# Patient Record
Sex: Female | Born: 1935 | Race: White | Hispanic: No
Health system: Southern US, Community
[De-identification: ages and names within clinical notes are randomized; demographics above are authoritative.]

## PROBLEM LIST (undated history)

## (undated) DIAGNOSIS — I4891 Unspecified atrial fibrillation: Secondary | ICD-10-CM

## (undated) DIAGNOSIS — F0390 Unspecified dementia without behavioral disturbance: Secondary | ICD-10-CM

---

## 2010-04-20 ENCOUNTER — Ambulatory Visit: Payer: Self-pay | Admitting: Emergency Medicine

## 2010-04-20 DIAGNOSIS — N390 Urinary tract infection, site not specified: Secondary | ICD-10-CM

## 2010-04-20 DIAGNOSIS — I10 Essential (primary) hypertension: Secondary | ICD-10-CM | POA: Insufficient documentation

## 2010-04-20 LAB — CONVERTED CEMR LAB
Bilirubin Urine: NEGATIVE
Ketones, urine, test strip: NEGATIVE
Protein, U semiquant: NEGATIVE
pH: 6.5

## 2010-04-21 ENCOUNTER — Encounter: Payer: Self-pay | Admitting: Emergency Medicine

## 2010-11-02 NOTE — Assessment & Plan Note (Signed)
Summary: POSS UTI/TJ   Vital Signs:  Patient Profile:   77 Years Old Female CC:      Dysuria x 4 days Height:     62 inches Weight:      145 pounds O2 Sat:      96 % O2 treatment:    Room Air Temp:     97.3 degrees F oral Pulse rate:   78 / minute Pulse rhythm:   regular Resp:     14 per minute BP sitting:   133 / 82  (right arm) Cuff size:   regular  Vitals Entered By: Emilio Math (April 20, 2010 1:48 PM)                  Current Allergies: ! Joyce Copa ! PREDNISONEHistory of Present Illness Chief Complaint: Dysuria x 4 days History of Present Illness: Patient with multiple medical complaints.  77yo WF with sinus drainage 1 week ago, went to [facility] and given a Zpak.  She then developed upset stomach, diarrhea that has mostly gone away.  Now with dysuria for the last few days and a subjective mild fever and mild HA.  Hasn't taken any other medicines.  She also c/o chronic knee and hand pain and back pain from OA, eye twitching.  She has a f/u appt with her PCP schedule this week.  Current Meds ATENOLOL 25 MG TABS (ATENOLOL)  CLARITIN 10 MG TABS (LORATADINE)  CELEBREX 50 MG CAPS (CELECOXIB)  LORTAB 5-500 MG TABS (HYDROCODONE-ACETAMINOPHEN)  FISH OIL 1000 MG CAPS (OMEGA-3 FATTY ACIDS)  VITAMIN D 400 UNIT TABS (CHOLECALCIFEROL)  GLUCOSAMINE 500 MG CAPS (GLUCOSAMINE SULFATE)  CIPROFLOXACIN HCL 250 MG TABS (CIPROFLOXACIN HCL) 1 tab by mouth two times a day for 7 days  REVIEW OF SYSTEMS Constitutional Symptoms      Denies fever, chills, night sweats, weight loss, weight gain, and fatigue.  Eyes       Denies change in vision, eye pain, eye discharge, glasses, contact lenses, and eye surgery. Ear/Nose/Throat/Mouth       Denies hearing loss/aids, change in hearing, ear pain, ear discharge, dizziness, frequent runny nose, frequent nose bleeds, sinus problems, sore throat, hoarseness, and tooth pain or bleeding.  Respiratory       Denies dry cough, productive cough, wheezing,  shortness of breath, asthma, bronchitis, and emphysema/COPD.  Cardiovascular       Denies murmurs, chest pain, and tires easily with exhertion.    Gastrointestinal       Denies stomach pain, nausea/vomiting, diarrhea, constipation, blood in bowel movements, and indigestion. Genitourniary       Complains of painful urination.      Denies kidney stones and loss of urinary control. Neurological       Denies paralysis, seizures, and fainting/blackouts. Musculoskeletal       Denies muscle pain, joint pain, joint stiffness, decreased range of motion, redness, swelling, muscle weakness, and gout.  Skin       Denies bruising, unusual mles/lumps or sores, and hair/skin or nail changes.  Psych       Denies mood changes, temper/anger issues, anxiety/stress, speech problems, depression, and sleep problems.  Past History:  Past Medical History: Polio Artritis Hypertension  Past Surgical History: Denies surgical history  Family History: Mother, D, Stroke Father, D, Stroke  Social History: Non smoker ETOH-no No DRugs Retired,  Physical Exam General appearance: well developed, well nourished, no acute distress Ears: normal, no lesions or deformities Nasal: mucosa pink, nonedematous, no septal deviation, turbinates  normal Oral/Pharynx: tongue normal, posterior pharynx without erythema or exudate Thyroid: no nodules, masses, tenderness, or enlargement Chest/Lungs: no rales, wheezes, or rhonchi bilateral, breath sounds equal without effort Abdomen: soft, non-tender without obvious organomegaly Back: no CVAT Skin: no obvious rashes or lesions MSE: oriented to time, place, and person Assessment New Problems: URINARY TRACT INFECTION, ACUTE (ICD-599.0) HYPERTENSION (ICD-401.9)   Plan New Medications/Changes: CIPROFLOXACIN HCL 250 MG TABS (CIPROFLOXACIN HCL) 1 tab by mouth two times a day for 7 days  #14 x 0, 04/20/2010, Hoyt Koch MD  New Orders: New Patient Level III  [phone] UA Dipstick w/o Micro (automated)  [81003] T-Culture, Urine [[id number]]  The patient and/or caregiver has been counseled thoroughly with regard to medications prescribed including dosage, schedule, interactions, rationale for use, and possible side effects and they verbalize understanding.  Diagnoses and expected course of recovery discussed and will return if not improved as expected or if the condition worsens. Patient and/or caregiver verbalized understanding.  Prescriptions: CIPROFLOXACIN HCL 250 MG TABS (CIPROFLOXACIN HCL) 1 tab by mouth two times a day for 7 days  #14 x 0   Entered and Authorized by:   Hoyt Koch MD   Signed by:   Hoyt Koch MD on 04/20/2010   Method used:   Printed then faxed to ...       [facility] (retail)       [address]       [place], [place]  [address]       Ph: [phone]       Fax: [phone]   RxID:   [id number]   Patient Instructions: 1)  Increase hydration 2)  Wipe front to back 3)  Rest 4)  Tylenol for headache/fever 5)  Follow-up with your primary care physician to discuss your other medical problems 6)  If symptoms gets more severe or more acute issues, go to the ER  Orders Added: 1)  New Patient Level III [99203] 2)  UA Dipstick w/o Micro (automated)  [81003] 3)  T-Culture, Urine [[id number]]  Laboratory Results   Urine Tests  Date/Time Received: April 20, 2010 2:06 PM  Date/Time Reported: April 20, 2010 2:06 PM   Routine Urinalysis   Color: yellow Appearance: Clear Glucose: negative   (Normal Range: Negative) Bilirubin: negative   (Normal Range: Negative) Ketone: negative   (Normal Range: Negative) Spec. Gravity: 1.015   (Normal Range: 1.003-1.035) Blood: small   (Normal Range: Negative) pH: 6.5   (Normal Range: 5.0-8.0) Protein: negative   (Normal Range: Negative) Urobilinogen: 0.2   (Normal Range: 0-1) Nitrite: negative   (Normal Range: Negative) Leukocyte Esterace: small   (Normal Range:  Negative)

## 2021-01-22 ENCOUNTER — Encounter (HOSPITAL_COMMUNITY): Payer: Self-pay | Admitting: *Deleted

## 2021-01-22 ENCOUNTER — Other Ambulatory Visit: Payer: Self-pay

## 2021-01-22 ENCOUNTER — Emergency Department (HOSPITAL_COMMUNITY): Payer: Medicare Other

## 2021-01-22 ENCOUNTER — Emergency Department (HOSPITAL_COMMUNITY)
Admission: EM | Admit: 2021-01-22 | Discharge: 2021-01-22 | Disposition: A | Discharge status: Home / Self Care | Payer: Medicare Other | Attending: Emergency Medicine | Admitting: Emergency Medicine

## 2021-01-22 DIAGNOSIS — W19XXXA Unspecified fall, initial encounter: Secondary | ICD-10-CM

## 2021-01-22 DIAGNOSIS — F0390 Unspecified dementia without behavioral disturbance: Secondary | ICD-10-CM | POA: Insufficient documentation

## 2021-01-22 DIAGNOSIS — I4891 Unspecified atrial fibrillation: Secondary | ICD-10-CM | POA: Diagnosis not present

## 2021-01-22 DIAGNOSIS — Y92121 Bathroom in nursing home as the place of occurrence of the external cause: Secondary | ICD-10-CM | POA: Insufficient documentation

## 2021-01-22 DIAGNOSIS — K029 Dental caries, unspecified: Secondary | ICD-10-CM | POA: Insufficient documentation

## 2021-01-22 DIAGNOSIS — S01111A Laceration without foreign body of right eyelid and periocular area, initial encounter: Secondary | ICD-10-CM | POA: Diagnosis not present

## 2021-01-22 DIAGNOSIS — W1812XA Fall from or off toilet with subsequent striking against object, initial encounter: Secondary | ICD-10-CM | POA: Diagnosis not present

## 2021-01-22 DIAGNOSIS — Z23 Encounter for immunization: Secondary | ICD-10-CM | POA: Diagnosis not present

## 2021-01-22 DIAGNOSIS — S025XXA Fracture of tooth (traumatic), initial encounter for closed fracture: Secondary | ICD-10-CM | POA: Insufficient documentation

## 2021-01-22 DIAGNOSIS — S5001XA Contusion of right elbow, initial encounter: Secondary | ICD-10-CM | POA: Diagnosis not present

## 2021-01-22 DIAGNOSIS — R52 Pain, unspecified: Secondary | ICD-10-CM

## 2021-01-22 DIAGNOSIS — S0993XA Unspecified injury of face, initial encounter: Secondary | ICD-10-CM | POA: Diagnosis present

## 2021-01-22 DIAGNOSIS — S0083XA Contusion of other part of head, initial encounter: Secondary | ICD-10-CM

## 2021-01-22 HISTORY — DX: Unspecified atrial fibrillation: I48.91

## 2021-01-22 HISTORY — DX: Unspecified dementia, unspecified severity, without behavioral disturbance, psychotic disturbance, mood disturbance, and anxiety: F03.90

## 2021-01-22 MED ORDER — LIDOCAINE-EPINEPHRINE-TETRACAINE (LET) TOPICAL GEL
3.0000 mL | Freq: Once | TOPICAL | Status: AC
  Administered 2021-01-22: 3 mL via TOPICAL
  Filled 2021-01-22: qty 3

## 2021-01-22 MED ORDER — ACETAMINOPHEN 500 MG PO TABS
1000.0000 mg | ORAL_TABLET | Freq: Once | ORAL | Status: AC
  Administered 2021-01-22: 1000 mg via ORAL
  Filled 2021-01-22: qty 2

## 2021-01-22 MED ORDER — TETANUS-DIPHTH-ACELL PERTUSSIS 5-2.5-18.5 LF-MCG/0.5 IM SUSY
0.5000 mL | PREFILLED_SYRINGE | Freq: Once | INTRAMUSCULAR | Status: AC
  Administered 2021-01-22: 0.5 mL via INTRAMUSCULAR
  Filled 2021-01-22: qty 0.5

## 2021-01-22 MED ORDER — LIDOCAINE-EPINEPHRINE (PF) 2 %-1:200000 IJ SOLN
10.0000 mL | Freq: Once | INTRAMUSCULAR | Status: AC
  Administered 2021-01-22: 10 mL
  Filled 2021-01-22: qty 20

## 2021-01-22 NOTE — ED Notes (Signed)
Called and spoke with Stephanie @[facility].

## 2021-01-22 NOTE — ED Notes (Signed)
Attempted x 2 to call [employer] [phone] no answer

## 2021-01-22 NOTE — Discharge Instructions (Addendum)
The CAT scan of the face and head are negative and there is no underlying injury.  The elbow x-ray is also normal.  The stitches need to be removed in 5 to 7 days.  Ice to the right eye to help with swelling.  Tylenol 500 mg every 6 hours as needed for pain.  Tetanus shot was updated today.

## 2021-01-22 NOTE — ED Notes (Signed)
Waiting PTAR 

## 2021-01-22 NOTE — ED Triage Notes (Signed)
Patient arrives to ED via [facility] states she was sitting on the commode and fell off, patient is in the independent/dememtia  Patient has a laceration over her right eye and beside her right eye, oozing blood , patient is on thinners. C/o pain right elbow with abrasion  And pain in her right knee. Patient vomited enroute x 1.

## 2021-01-22 NOTE — ED Provider Notes (Signed)
[facility] EMERGENCY DEPARTMENT Provider Note   CSN: [id number] Arrival date & time: 01/22/21  1022     History Chief Complaint  Patient presents with  . Fall    Sandra Ramirez is a 87 y.o. female.  Patient is a 87 year old female with a history of atrial fibrillation, dementia, recurrent falls, unsteady gait who is presenting today with paramedics after a fall at her assisted living.  Patient lives in a dementia assisted living unit.  She had fallen off the toilet and hit the right side of her face.  It is unclear if anybody witnessed this fall.  Patient reports that she cannot remember.  She does not remember if she was feeling well when she woke up this morning.  It was not reported that she had any nausea vomiting or change in behavior recently.  Patient does take Pradaxa for her atrial fibrillation.  She did hit the right side of her face and is also complaining of pain in her right elbow.  She had 1 episode of emesis while being transported to the hospital but denies any nausea now.  She did not receive any medications prior to arrival.  Unknown known last tetanus shot.  Patient's daughter is supposed to be coming who may be able to put supply more information.  The history is provided by the patient, the EMS personnel and the nursing home.  Fall This is a recurrent problem.       Past Medical History:  Diagnosis Date  . A-fib (HCC)   . Dementia (HCC)     There are no problems to display for this patient.      OB History   No obstetric history on file.     No family history on file.  Social History   Tobacco Use  . Smoking status: Unknown If Ever Smoked  Substance Use Topics  . Alcohol use: Not Currently  . Drug use: Never    Home Medications Prior to Admission medications   Not on File    Allergies    Patient has no allergy information on record.  Review of Systems   Review of Systems  Unable to perform ROS: Dementia     Physical Exam Updated Vital Signs BP 134/78 (BP Location: Left Arm)   Pulse 64   Temp (!) 97.4 F (36.3 C) (Oral)   Resp 20   Ht 5\' 5"  (1.651 m)   Wt 72.6 kg   SpO2 94%   BMI 26.63 kg/m   Physical Exam Vitals and nursing note reviewed.  Constitutional:      General: She is not in acute distress.    Appearance: She is well-developed.  HENT:     Head: Normocephalic and atraumatic.      Mouth/Throat:     Comments: Multiple dental caries and broken teeth noted Eyes:     Pupils: Pupils are equal, round, and reactive to light.  Cardiovascular:     Rate and Rhythm: Normal rate and regular rhythm.     Pulses: Normal pulses.     Heart sounds: Normal heart sounds. No murmur heard. No friction rub.  Pulmonary:     Effort: Pulmonary effort is normal.     Breath sounds: Normal breath sounds. No wheezing or rales.  Abdominal:     General: Bowel sounds are normal. There is no distension.     Palpations: Abdomen is soft.     Tenderness: There is no abdominal tenderness. There is no guarding  or rebound.  Musculoskeletal:        General: Tenderness present. Normal range of motion.     Right shoulder: Normal.     Left shoulder: Normal.     Left elbow: Normal.       Arms:     Cervical back: Normal range of motion and neck supple. No tenderness. No spinous process tenderness or muscular tenderness.     Right hip: Normal.     Left hip: Normal.     Right knee: Normal.     Left knee: Normal.       Legs:     Comments: No edema  Skin:    General: Skin is warm and dry.     Findings: No rash.  Neurological:     Mental Status: She is alert.     Cranial Nerves: No cranial nerve deficit.     Comments: Oriented to self.  Able to move all extremities no numbness or weakness noted.  Psychiatric:     Comments: Calm and cooperative      ED Results / Procedures / Treatments   Labs (all labs ordered are listed, but only abnormal results are displayed) Labs Reviewed - No data to  display  EKG EKG Interpretation  Date/Time:  Friday January 22 2021 10:53:23 EDT Ventricular Rate:  62 PR Interval:  175 QRS Duration: 106 QT Interval:  416 QTC Calculation: 423 R Axis:   57 Text Interpretation: Sinus rhythm Artifact Confirmed by Gwyneth SproutPlunkett, Cannon Arreola ([id number]) on 01/22/2021 10:54:58 AM   Radiology DG Elbow 2 Views Right  Result Date: 01/22/2021 CLINICAL DATA:  Pain post fall EXAM: RIGHT ELBOW - 2 VIEW COMPARISON:  None FINDINGS: Patient refused four view series, so two views were performed. Osseous mineralization normal. Joint spaces preserved. No acute fracture, dislocation, or bone destruction. No joint effusion. IMPRESSION: No acute osseous abnormalities. Electronically Signed   By: Ulyses SouthwardMark  Boles M.D.   On: 01/22/2021 12:06   CT Head Wo Contrast  Result Date: 01/22/2021 CLINICAL DATA:  Head trauma EXAM: CT HEAD WITHOUT CONTRAST TECHNIQUE: Contiguous axial images were obtained from the base of the skull through the vertex without intravenous contrast. COMPARISON:  August 29, 2015. FINDINGS: Brain: No evidence of acute infarction, hemorrhage, hydrocephalus, extra-axial collection or mass lesion/mass effect. Similar patchy white matter hypoattenuation, likely related to moderate to advanced chronic microvascular ischemic disease. Similar generalized cerebral atrophy and ventriculomegaly. Vascular: Calcific atherosclerosis. No hyperdense vessel identified. Skull: Right frontal scalp contusion without acute fracture. Sinuses/Orbits: Evaluated on concurrent maxillofacial CT. Other: No mastoid effusions. IMPRESSION: 1. No evidence of acute intracranial abnormality. 2. Right frontal scalp contusion without acute calvarial fracture. Please see concurrent maxillofacial CT for evaluation of the face. 3. Similar chronic microvascular ischemic disease, atrophy, and chronic ventriculomegaly. Electronically Signed   By: Feliberto HartsFrederick S Jones MD   On: 01/22/2021 11:48   DG Chest Port 1 View  Result  Date: 01/22/2021 CLINICAL DATA:  Fall.  On blood thinner for atrial fibrillation EXAM: PORTABLE CHEST 1 VIEW COMPARISON:  None. FINDINGS: Heart size and vascularity normal. Negative for heart failure. Atherosclerotic calcification thoracic aorta. Lungs clear without infiltrate effusion Advanced degenerative change in the shoulder bilaterally. IMPRESSION: No active disease. Electronically Signed   By: Marlan Palauharles  Clark M.D.   On: 01/22/2021 11:00   CT Maxillofacial Wo Contrast  Result Date: 01/22/2021 CLINICAL DATA:  Fall, facial trauma on the right. EXAM: CT MAXILLOFACIAL WITHOUT CONTRAST TECHNIQUE: Multidetector CT imaging of the maxillofacial structures was performed. Multiplanar CT  image reconstructions were also generated. COMPARISON:  CT head 01/22/2021 FINDINGS: Osseous: Negative for facial fracture Cervical spondylosis and spinal stenosis C2-3 and C3-4. Spinal stenosis at C3-4 appears moderate to severe. Orbits: Negative for fracture of the orbit. No orbital mass or edema. Bilateral cataract extraction. Sinuses: Mild mucosal edema paranasal sinuses.  No air-fluid level Soft tissues: Right supraorbital scalp contusion and hematoma Limited intracranial: CT head reported separately. IMPRESSION: Right supraorbital scalp hematoma.  Negative for facial fracture Cervical spondylosis with moderate to severe spinal stenosis at C3-4 and moderate spinal stenosis at C2-3. Electronically Signed   By: Marlan Palau M.D.   On: 01/22/2021 11:37    Procedures Procedures   LACERATION REPAIR Performed by: [facility] Authorized by: Gwyneth Sprout Consent: Verbal consent obtained. Risks and benefits: risks, benefits and alternatives were discussed Consent given by: patient Patient identity confirmed: provided demographic data Prepped and Draped in normal sterile fashion Wound explored  Laceration Location: right eyebrow  Laceration Length: 3cm  No Foreign Bodies seen or palpated  Anesthesia: local  infiltration  Local anesthetic: lidocaine 2% with epinephrine  Anesthetic total: 4 ml  Irrigation method: syringe Amount of cleaning: standard  Skin closure: 6.0 prolene  Number of sutures: 5  Technique: simple interrupted  Patient tolerance: Patient tolerated the procedure well with no immediate complications.   Medications Ordered in ED Medications  lidocaine-EPINEPHrine-tetracaine (LET) topical gel (has no administration in time range)    ED Course  I have reviewed the triage vital signs and the nursing notes.  Pertinent labs & imaging results that were available during my care of the patient were reviewed by me and considered in my medical decision making (see chart for details).    MDM Rules/Calculators/A&P                          Elderly female presenting today after a fall at home.  Assume this fall was unwitnessed but patient does have a history of unsteady gait and recurrent falls.  She does take Pradaxa for atrial fibrillation.  Patient has notable injury to the right side of the face with minor lacerations.  Patient was a level 2 given fall with head trauma and anticoagulation.  Head CT, facial CT are pending.  Unsure of the circumstance of the fall will wait for daughter to arrive to provide more history.  She also has some pain over the right elbow which will be x-rayed.  She otherwise has no other signs of trauma.  She is well-appearing awake and alert.  12:23 PM Patient's son has now arrived and reports that she was in the bathroom today with nursing staff and she was not to get up but she attempted to get up and fell forward hitting her face.  There was no syncope and patient has otherwise been acting her normal self.  She is in a wheelchair all the time due to severe weakness in her legs from polio and recurrent falls.  Patient's head CT, facial CT and elbow films are without acute findings.  Facial laceration was repaired as above.  Son did not know when her last  tetanus shot was and it is overdue in our system.  Updated today.  Patient is stable for discharge.  MDM Number of Diagnoses or Management Options   Amount and/or Complexity of Data Reviewed Tests in the radiology section of CPT: ordered and reviewed Independent visualization of images, tracings, or specimens: yes    Final  Clinical Impression(s) / ED Diagnoses Final diagnoses:  Fall, initial encounter  Contusion of face, initial encounter  Laceration of right eyebrow, initial encounter    Rx / DC Orders ED Discharge Orders    None       Gwyneth Sprout, MD 01/22/21 1256

## 2021-01-22 NOTE — ED Notes (Signed)
Patient transported to CT 

## 2021-01-22 NOTE — ED Notes (Signed)
[address] [place] and spoke with Phyllis Ginger. He is the pt's nurse. He stated there is no problem with the pot coming back with family member

## 2021-01-22 NOTE — ED Notes (Signed)
Called for PTAR added to list at [phone]

## 2021-01-22 NOTE — ED Notes (Signed)
Son at bedside patient remains in xray

## 2021-07-03 DEATH — deceased

## 2021-09-19 IMAGING — CT CT MAXILLOFACIAL W/O CM
3 series · 14 of 47 positions shown, 16 images · non-contrast
Comparison: CT head 01/22/2021

CLINICAL DATA: Fall, facial trauma on the right.

EXAM:
CT MAXILLOFACIAL WITHOUT CONTRAST
TECHNIQUE: Multidetector CT imaging of the maxillofacial structures was
performed. Multiplanar CT image reconstructions were also generated.

[Series 3: facial/ orbits 2.0 h30s · axial · 0.36mm/px · z∈[-165,-33]mm · 8 of 78 slices shown, 10 images]
[im 6/78  brain]
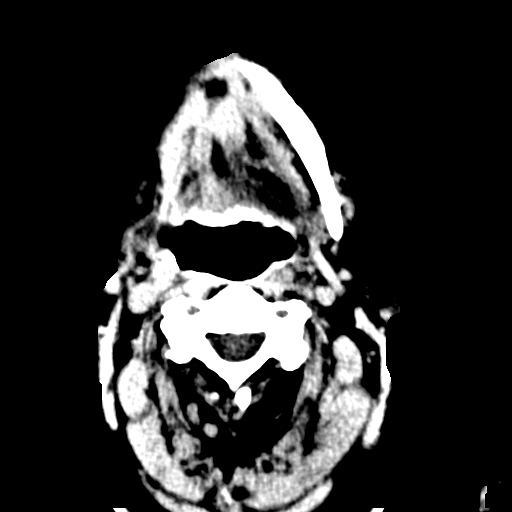
[im 6/78  bone]
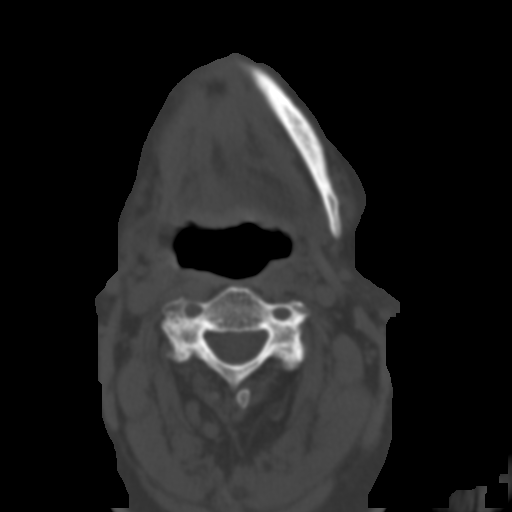
[im 16/78  bone]
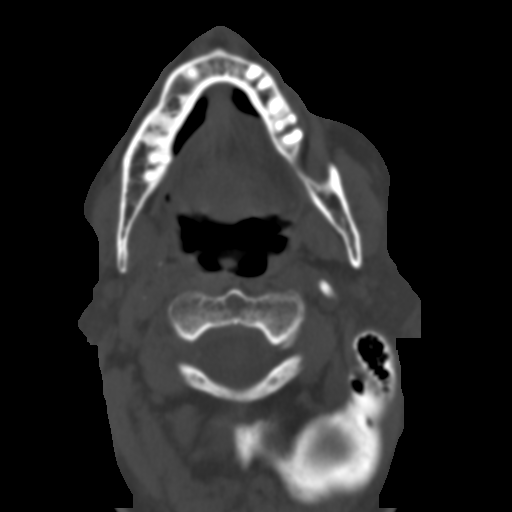
[im 24/78  bone]
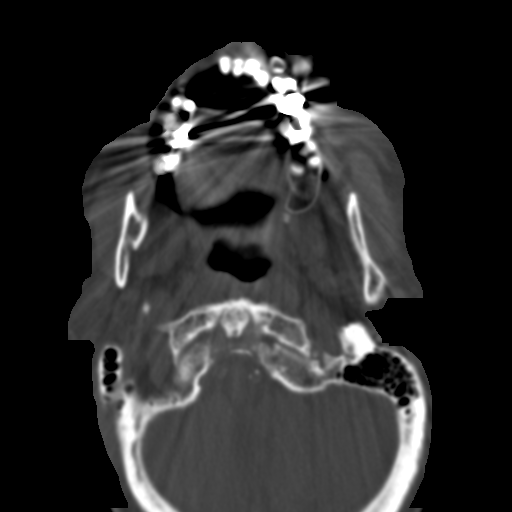
[im 35/78  bone]
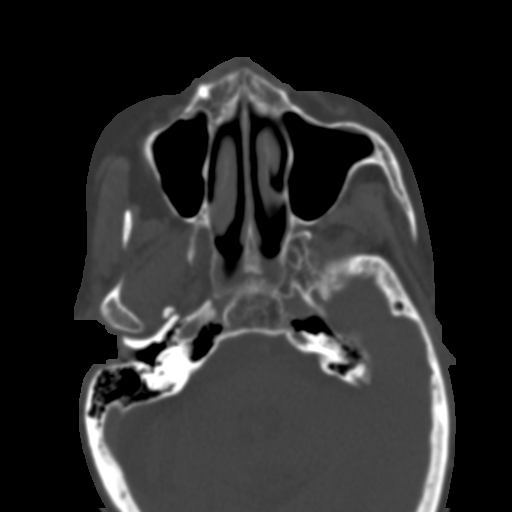
[im 43/78  brain]
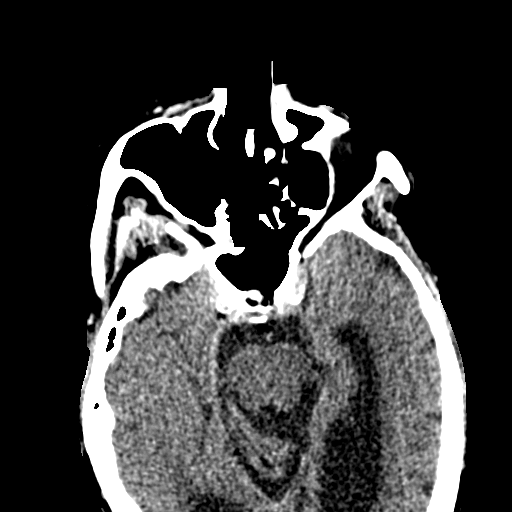
[im 43/78  bone]
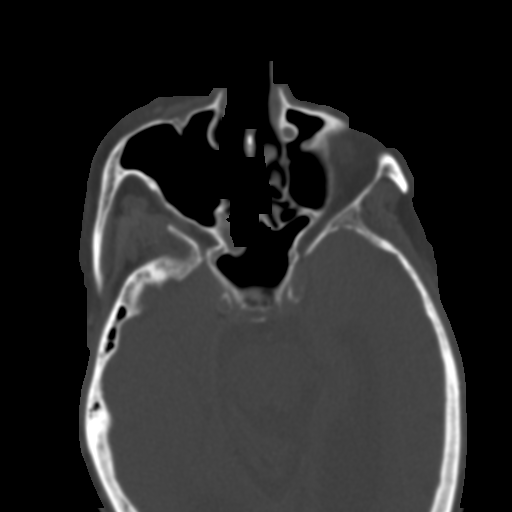
[im 54/78  bone]
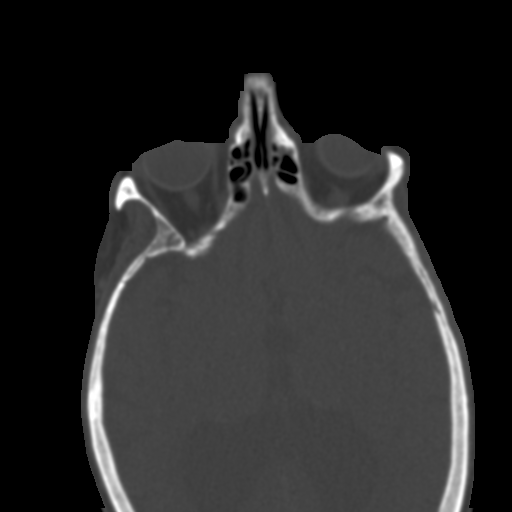
[im 62/78  bone]
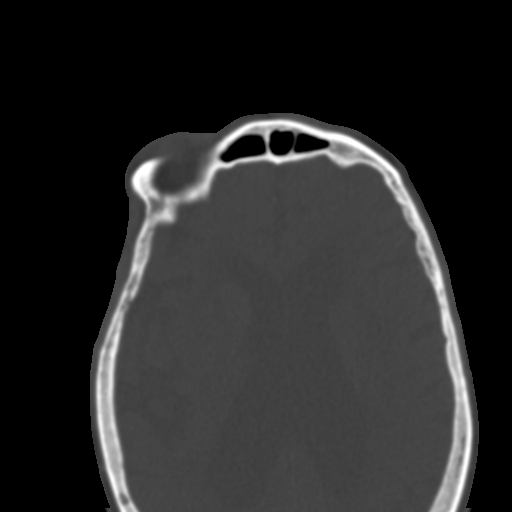
[im 72/78  bone]
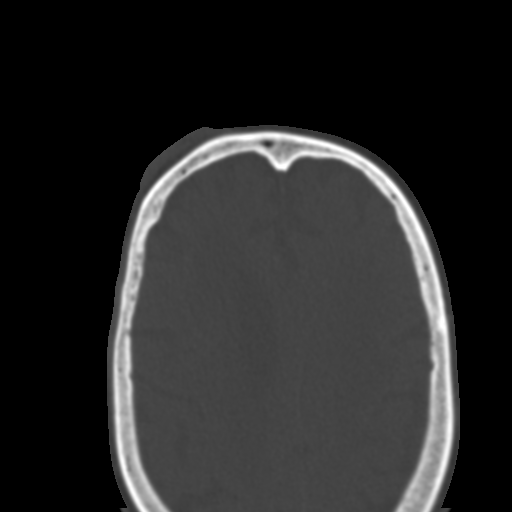

[Series 7: coronal soft tissue · coronal · 0.38mm/px · 3 of 107 slices shown]
[im 36/107  bone]
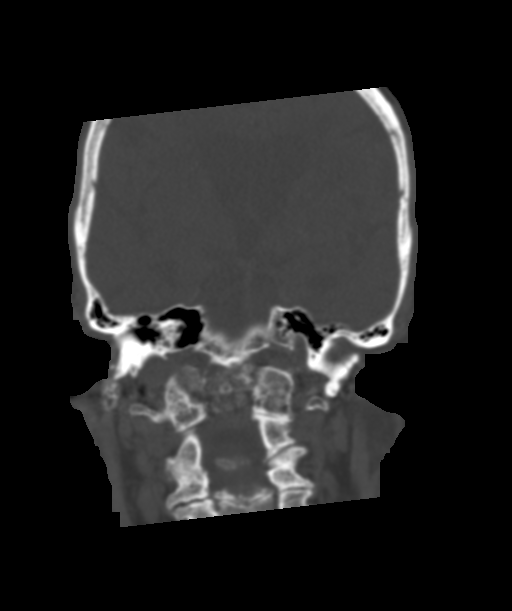
[im 48/107  bone]
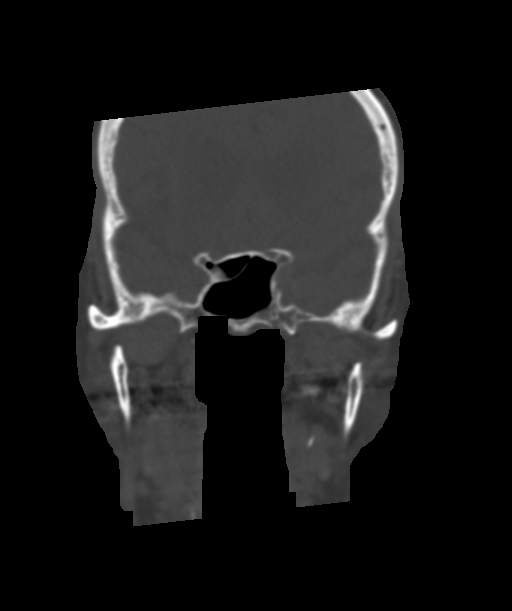
[im 59/107  bone]
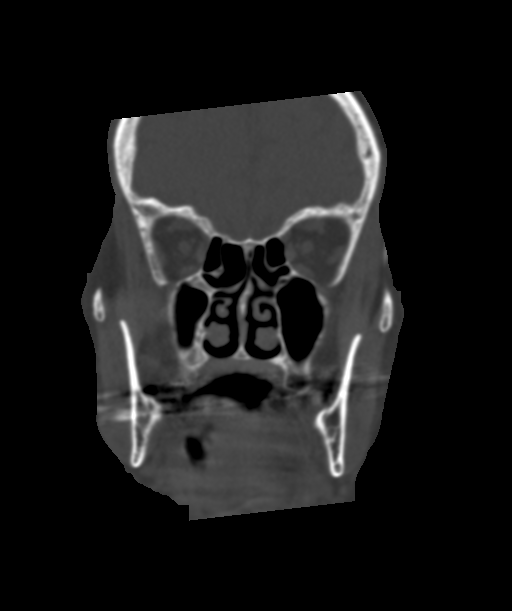

[Series 9: sagittal soft tissue · sagittal · 0.37mm/px · 3 of 89 slices shown]
[im 30/89  bone]
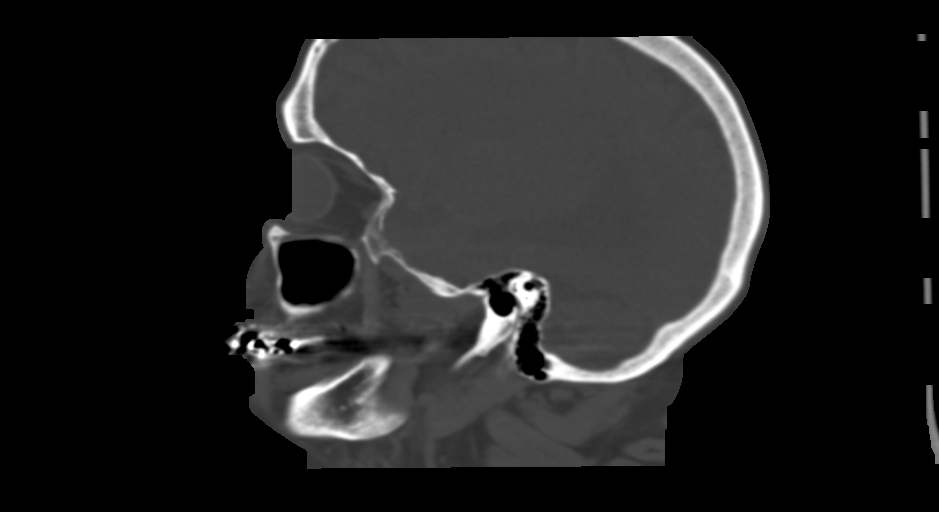
[im 45/89  bone]
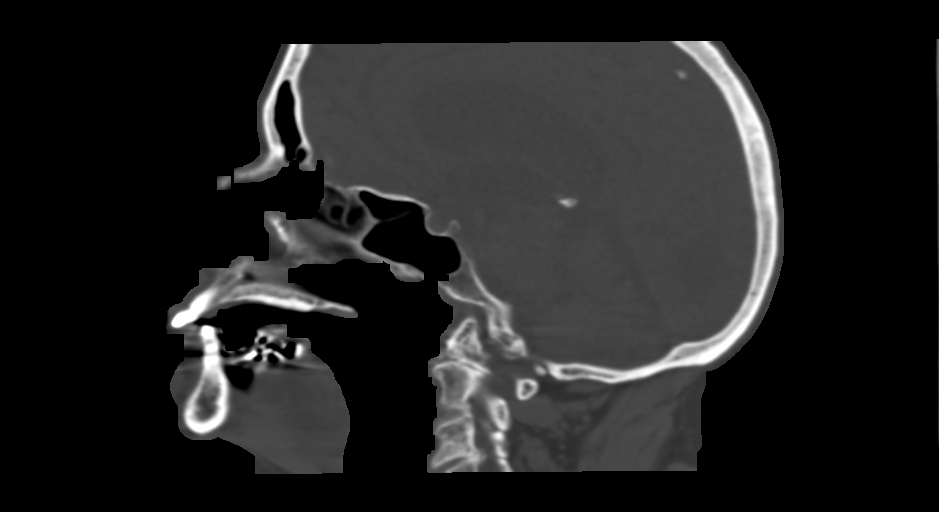
[im 59/89  bone]
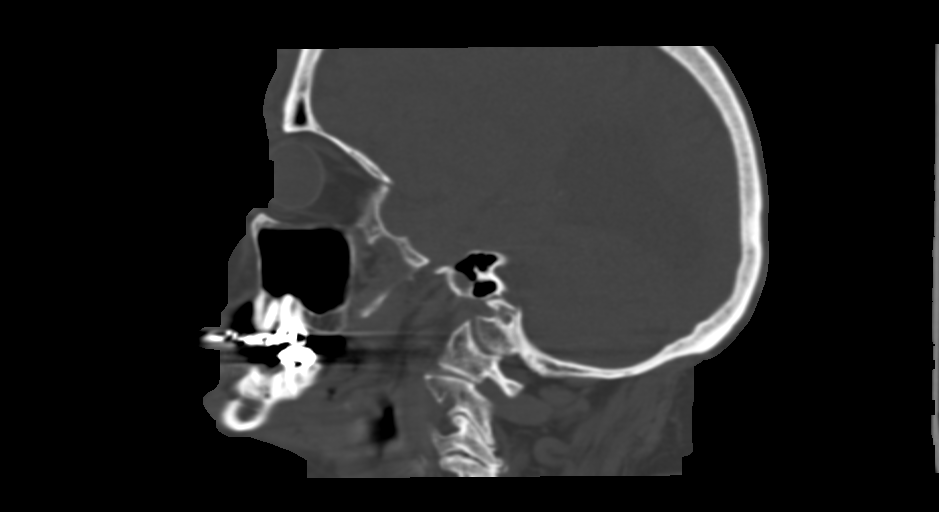

[14 of 47 positions shown; findings below may reference images not displayed]

FINDINGS: Osseous: Negative for facial fracture

Cervical spondylosis and spinal stenosis C2-3 and C3-4. Spinal
stenosis at C3-4 appears moderate to severe.

Orbits: Negative for fracture of the orbit. No orbital mass or
edema. Bilateral cataract extraction.

Sinuses: Mild mucosal edema paranasal sinuses.  No air-fluid level

Soft tissues: Right supraorbital scalp contusion and hematoma

Limited intracranial: CT head reported separately.
IMPRESSION: Right supraorbital scalp hematoma.  Negative for facial fracture

Cervical spondylosis with moderate to severe spinal stenosis at C3-4
and moderate spinal stenosis at C2-3.
